# Patient Record
Sex: Male | Born: 1960 | Race: White | Hispanic: No | Marital: Single | Smoking: Never smoker
Health system: Southern US, Community
[De-identification: ages and names within clinical notes are randomized; demographics above are authoritative.]

## PROBLEM LIST (undated history)

## (undated) DIAGNOSIS — C801 Malignant (primary) neoplasm, unspecified: Secondary | ICD-10-CM

## (undated) DIAGNOSIS — L659 Nonscarring hair loss, unspecified: Secondary | ICD-10-CM

## (undated) DIAGNOSIS — B009 Herpesviral infection, unspecified: Secondary | ICD-10-CM

## (undated) DIAGNOSIS — M1990 Unspecified osteoarthritis, unspecified site: Secondary | ICD-10-CM

## (undated) HISTORY — DX: Malignant (primary) neoplasm, unspecified: C80.1

## (undated) HISTORY — PX: LYMPH NODE BIOPSY: SHX201

## (undated) HISTORY — DX: Herpesviral infection, unspecified: B00.9

---

## 2014-05-23 ENCOUNTER — Emergency Department (HOSPITAL_BASED_OUTPATIENT_CLINIC_OR_DEPARTMENT_OTHER)
Admission: EM | Admit: 2014-05-23 | Discharge: 2014-05-23 | Disposition: A | Discharge status: Home / Self Care | Payer: No Typology Code available for payment source | Attending: Emergency Medicine | Admitting: Emergency Medicine

## 2014-05-23 ENCOUNTER — Encounter (HOSPITAL_BASED_OUTPATIENT_CLINIC_OR_DEPARTMENT_OTHER): Payer: Self-pay | Admitting: *Deleted

## 2014-05-23 ENCOUNTER — Emergency Department (HOSPITAL_BASED_OUTPATIENT_CLINIC_OR_DEPARTMENT_OTHER): Payer: No Typology Code available for payment source

## 2014-05-23 DIAGNOSIS — Z791 Long term (current) use of non-steroidal anti-inflammatories (NSAID): Secondary | ICD-10-CM | POA: Diagnosis not present

## 2014-05-23 DIAGNOSIS — Z79899 Other long term (current) drug therapy: Secondary | ICD-10-CM | POA: Insufficient documentation

## 2014-05-23 DIAGNOSIS — S161XXA Strain of muscle, fascia and tendon at neck level, initial encounter: Secondary | ICD-10-CM | POA: Diagnosis not present

## 2014-05-23 DIAGNOSIS — Y9389 Activity, other specified: Secondary | ICD-10-CM | POA: Diagnosis not present

## 2014-05-23 DIAGNOSIS — Z8739 Personal history of other diseases of the musculoskeletal system and connective tissue: Secondary | ICD-10-CM | POA: Diagnosis not present

## 2014-05-23 DIAGNOSIS — Z872 Personal history of diseases of the skin and subcutaneous tissue: Secondary | ICD-10-CM | POA: Insufficient documentation

## 2014-05-23 DIAGNOSIS — S39012A Strain of muscle, fascia and tendon of lower back, initial encounter: Secondary | ICD-10-CM

## 2014-05-23 DIAGNOSIS — Y998 Other external cause status: Secondary | ICD-10-CM | POA: Insufficient documentation

## 2014-05-23 DIAGNOSIS — S29012A Strain of muscle and tendon of back wall of thorax, initial encounter: Secondary | ICD-10-CM | POA: Insufficient documentation

## 2014-05-23 DIAGNOSIS — S3992XA Unspecified injury of lower back, initial encounter: Secondary | ICD-10-CM | POA: Diagnosis present

## 2014-05-23 DIAGNOSIS — Y92410 Unspecified street and highway as the place of occurrence of the external cause: Secondary | ICD-10-CM | POA: Insufficient documentation

## 2014-05-23 DIAGNOSIS — S29019A Strain of muscle and tendon of unspecified wall of thorax, initial encounter: Secondary | ICD-10-CM

## 2014-05-23 HISTORY — DX: Nonscarring hair loss, unspecified: L65.9

## 2014-05-23 HISTORY — DX: Unspecified osteoarthritis, unspecified site: M19.90

## 2014-05-23 MED ORDER — CYCLOBENZAPRINE HCL 10 MG PO TABS: 10.0000 mg | ORAL_TABLET | Freq: Three times a day (TID) | ORAL | Status: AC | PRN
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
## 2014-05-23 NOTE — Discharge Instructions (Signed)
Ibuprofen 600 mg every 6 hours as needed for pain.  Flexeril as prescribed as needed for pain not relieved with ibuprofen.  Follow-up with your regular doctor if not improving in the next week, and return to the ER if you experience any new and concerning symptoms.   Cervical Sprain A cervical sprain is an injury in the neck in which the strong, fibrous tissues (ligaments) that connect your neck bones stretch or tear. Cervical sprains can range from mild to severe. Severe cervical sprains can cause the neck vertebrae to be unstable. This can lead to damage of the spinal cord and can result in serious nervous system problems. The amount of time it takes for a cervical sprain to get better depends on the cause and extent of the injury. Most cervical sprains heal in 1 to 3 weeks. CAUSES  Severe cervical sprains may be caused by:   Contact sport injuries (such as from football, rugby, wrestling, hockey, auto racing, gymnastics, diving, martial arts, or boxing).   Motor vehicle collisions.   Whiplash injuries. This is an injury from a sudden forward and backward whipping movement of the head and neck.  Falls.  Mild cervical sprains may be caused by:   Being in an awkward position, such as while cradling a telephone between your ear and shoulder.   Sitting in a chair that does not offer proper support.   Working at a poorly Landscape architect station.   Looking up or down for long periods of time.  SYMPTOMS   Pain, soreness, stiffness, or a burning sensation in the front, back, or sides of the neck. This discomfort may develop immediately after the injury or slowly, 24 hours or more after the injury.   Pain or tenderness directly in the middle of the back of the neck.   Shoulder or upper back pain.   Limited ability to move the neck.   Headache.   Dizziness.   Weakness, numbness, or tingling in the hands or arms.   Muscle spasms.   Difficulty swallowing or  chewing.   Tenderness and swelling of the neck.  DIAGNOSIS  Most of the time your health care provider can diagnose a cervical sprain by taking your history and doing a physical exam. Your health care provider will ask about previous neck injuries and any known neck problems, such as arthritis in the neck. X-rays may be taken to find out if there are any other problems, such as with the bones of the neck. Other tests, such as a CT scan or MRI, may also be needed.  TREATMENT  Treatment depends on the severity of the cervical sprain. Mild sprains can be treated with rest, keeping the neck in place (immobilization), and pain medicines. Severe cervical sprains are immediately immobilized. Further treatment is done to help with pain, muscle spasms, and other symptoms and may include:  Medicines, such as pain relievers, numbing medicines, or muscle relaxants.   Physical therapy. This may involve stretching exercises, strengthening exercises, and posture training. Exercises and improved posture can help stabilize the neck, strengthen muscles, and help stop symptoms from returning.  HOME CARE INSTRUCTIONS   Put ice on the injured area.   Put ice in a plastic bag.   Place a towel between your skin and the bag.   Leave the ice on for 15-20 minutes, 3-4 times a day.   If your injury was severe, you may have been given a cervical collar to wear. A cervical collar is a two-piece  collar designed to keep your neck from moving while it heals.  Do not remove the collar unless instructed by your health care provider.  If you have long hair, keep it outside of the collar.  Ask your health care provider before making any adjustments to your collar. Minor adjustments may be required over time to improve comfort and reduce pressure on your chin or on the back of your head.  Ifyou are allowed to remove the collar for cleaning or bathing, follow your health care provider's instructions on how to do so  safely.  Keep your collar clean by wiping it with mild soap and water and drying it completely. If the collar you have been given includes removable pads, remove them every 1-2 days and hand wash them with soap and water. Allow them to air dry. They should be completely dry before you wear them in the collar.  If you are allowed to remove the collar for cleaning and bathing, wash and dry the skin of your neck. Check your skin for irritation or sores. If you see any, tell your health care provider.  Do not drive while wearing the collar.   Only take over-the-counter or prescription medicines for pain, discomfort, or fever as directed by your health care provider.   Keep all follow-up appointments as directed by your health care provider.   Keep all physical therapy appointments as directed by your health care provider.   Make any needed adjustments to your workstation to promote good posture.   Avoid positions and activities that make your symptoms worse.   Warm up and stretch before being active to help prevent problems.  SEEK MEDICAL CARE IF:   Your pain is not controlled with medicine.   You are unable to decrease your pain medicine over time as planned.   Your activity level is not improving as expected.  SEEK IMMEDIATE MEDICAL CARE IF:   You develop any bleeding.  You develop stomach upset.  You have signs of an allergic reaction to your medicine.   Your symptoms get worse.   You develop new, unexplained symptoms.   You have numbness, tingling, weakness, or paralysis in any part of your body.  MAKE SURE YOU:   Understand these instructions.  Will watch your condition.  Will get help right away if you are not doing well or get worse. Document Released: 04/05/2007 Document Revised: 06/13/2013 Document Reviewed: 12/14/2012 [facility] Patient Information 2015 [place], [place]. This information is not intended to replace advice given to you by your health care  provider. Make sure you discuss any questions you have with your health care provider.  Lumbosacral Strain Lumbosacral strain is a strain of any of the parts that make up your lumbosacral vertebrae. Your lumbosacral vertebrae are the bones that make up the lower third of your backbone. Your lumbosacral vertebrae are held together by muscles and tough, fibrous tissue (ligaments).  CAUSES  A sudden blow to your back can cause lumbosacral strain. Also, anything that causes an excessive stretch of the muscles in the low back can cause this strain. This is typically seen when people exert themselves strenuously, fall, lift heavy objects, bend, or crouch repeatedly. RISK FACTORS  Physically demanding work.  Participation in pushing or pulling sports or sports that require a sudden twist of the back (tennis, golf, baseball).  Weight lifting.  Excessive lower back curvature.  Forward-tilted pelvis.  Weak back or abdominal muscles or both.  Tight hamstrings. SIGNS AND SYMPTOMS  Lumbosacral strain  may cause pain in the area of your injury or pain that moves (radiates) down your leg.  DIAGNOSIS Your health care provider can often diagnose lumbosacral strain through a physical exam. In some cases, you may need tests such as X-ray exams.  TREATMENT  Treatment for your lower back injury depends on many factors that your clinician will have to evaluate. However, most treatment will include the use of anti-inflammatory medicines. HOME CARE INSTRUCTIONS   Avoid hard physical activities (tennis, racquetball, waterskiing) if you are not in proper physical condition for it. This may aggravate or create problems.  If you have a back problem, avoid sports requiring sudden body movements. Swimming and walking are generally safer activities.  Maintain good posture.  Maintain a healthy weight.  For acute conditions, you may put ice on the injured area.  Put ice in a plastic bag.  Place a towel  between your skin and the bag.  Leave the ice on for 20 minutes, 2-3 times a day.  When the low back starts healing, stretching and strengthening exercises may be recommended. SEEK MEDICAL CARE IF:  Your back pain is getting worse.  You experience severe back pain not relieved with medicines. SEEK IMMEDIATE MEDICAL CARE IF:   You have numbness, tingling, weakness, or problems with the use of your arms or legs.  There is a change in bowel or bladder control.  You have increasing pain in any area of the body, including your belly (abdomen).  You notice shortness of breath, dizziness, or feel faint.  You feel sick to your stomach (nauseous), are throwing up (vomiting), or become sweaty.  You notice discoloration of your toes or legs, or your feet get very cold. MAKE SURE YOU:   Understand these instructions.  Will watch your condition.  Will get help right away if you are not doing well or get worse. Document Released: 03/18/2005 Document Revised: 06/13/2013 Document Reviewed: 01/25/2013 [facility] Patient Information 2015 [place], [place]. This information is not intended to replace advice given to you by your health care provider. Make sure you discuss any questions you have with your health care provider.

## 2014-12-04 ENCOUNTER — Telehealth: Payer: Self-pay | Admitting: Hematology and Oncology

## 2014-12-04 NOTE — Telephone Encounter (Signed)
new patient appt-s/w patient and gave np appt for 06/27 @ 11:15 w/Dr. Alvy Bimler

## 2014-12-17 ENCOUNTER — Encounter: Payer: Self-pay | Admitting: Hematology and Oncology

## 2014-12-17 ENCOUNTER — Ambulatory Visit: Payer: No Typology Code available for payment source

## 2014-12-17 ENCOUNTER — Ambulatory Visit (HOSPITAL_BASED_OUTPATIENT_CLINIC_OR_DEPARTMENT_OTHER): Payer: No Typology Code available for payment source | Admitting: Hematology and Oncology

## 2014-12-17 VITALS — BP 131/76 | HR 72 | Temp 98.6°F | Resp 18 | Ht 73.0 in | Wt 291.6 lb

## 2014-12-17 DIAGNOSIS — C8588 Other specified types of non-Hodgkin lymphoma, lymph nodes of multiple sites: Secondary | ICD-10-CM | POA: Diagnosis not present

## 2014-12-17 NOTE — Progress Notes (Signed)
I checked in new patient with no issues prior to seeing the dr °

## 2014-12-18 ENCOUNTER — Encounter: Payer: Self-pay | Admitting: Hematology and Oncology

## 2014-12-18 DIAGNOSIS — C8588 Other specified types of non-Hodgkin lymphoma, lymph nodes of multiple sites: Secondary | ICD-10-CM | POA: Insufficient documentation

## 2014-12-18 NOTE — Progress Notes (Signed)
[facility] NOTE  Patient Care Team: Jefm Petty, MD as PCP - General (Family Medicine)  CHIEF COMPLAINTS/PURPOSE OF CONSULTATION:  Second opinion for marginal zone lymphoma  HISTORY OF PRESENTING ILLNESS:  Mitchell Gordon 54 y.o. male is here because of recent diagnosis of lymphoma. Disposition was otherwise healthy. Around March 2016, he self palpated a lump in the right supraclavicular region. His outside records is scant at best. I review all his records including imaging study and collaborated the history with this patient. 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 He has new lymph nodes growing in his right axilla over the last 30 days. He has port placed last week with plan to start chemotherapy with bendamustine and rituximab on 12/18/2014 in another facility. The patient denies abnormal anorexia, weight loss or night sweats. No reported skin itching.  MEDICAL HISTORY:  Past Medical History  Diagnosis Date  . Alopecia   . Arthritis   . Herpes simplex infection   . Cancer     nodal marginal zone lymphoma    SURGICAL HISTORY: Past Surgical History  Procedure Laterality Date  . Lymph node biopsy      SOCIAL HISTORY: He is divorced, with shared custody with his ex-wife. He has a 52 year old daughter. His next  of kin including 1 sister in [place] and another one in [place] History   Social History  . Marital Status: Single    Spouse Name: N/A  . Number of Children: N/A  . Years of Education: N/A   Occupational History  . Not on file.   Social History Main Topics  . Smoking status: Never Smoker   . Smokeless tobacco: Not on file  . Alcohol Use: No  . Drug Use: No  . Sexual Activity: No   Other Topics Concern  . Not on file   Social History Narrative    FAMILY HISTORY: No family history of lymphoma. No family history on file.  ALLERGIES:  has no allergies on file.  MEDICATIONS:  Current Outpatient Prescriptions  Medication Sig Dispense Refill  . finasteride (PROSCAR) 5 MG tablet Take 5 mg by mouth daily.    . valACYclovir (VALTREX) 500 MG tablet Take 500 mg by mouth daily.    Marland Kitchen zolpidem (AMBIEN) 10 MG tablet Take 10 mg by mouth at bedtime as needed for sleep.    Marland Kitchen allopurinol (ZYLOPRIM) 300 MG tablet Take 300 mg by mouth daily.    . cyclobenzaprine (FLEXERIL) 10 MG tablet Take 1 tablet (10 mg total) by mouth 3 (three) times daily as needed for muscle spasms. (Patient not taking: Reported on 12/17/2014) 15 tablet 0  . HYDROcodone-acetaminophen (NORCO) 10-325 MG per tablet Take 1 tablet by mouth every 4 (four) hours as needed.    . meloxicam (MOBIC) 7.5 MG tablet Take 7.5 mg by mouth daily.    . ondansetron (ZOFRAN) 8  MG tablet Take by mouth every 8 (eight) hours as needed for nausea or vomiting.     No current facility-administered medications for this visit.    REVIEW OF SYSTEMS:   Constitutional: Denies fevers, chills or abnormal night sweats Eyes: Denies blurriness of vision, double vision or watery eyes Ears, nose, mouth, throat, and face: Denies mucositis or sore throat Respiratory: Denies cough, dyspnea or wheezes Cardiovascular: Denies palpitation, chest discomfort or lower extremity swelling Gastrointestinal:  Denies nausea, heartburn or change in bowel  habits Skin: Denies abnormal skin rashes Neurological:Denies numbness, tingling or new weaknesses Behavioral/Psych: Mood is stable, no new changes  All other systems were reviewed with the patient and are negative.  PHYSICAL EXAMINATION: ECOG PERFORMANCE STATUS: 0 - Asymptomatic  Filed Vitals:   12/17/14 1136  BP: 131/76  Pulse: 72  Temp: 98.6 F (37 C)  Resp: 18   Filed Weights   12/17/14 1136  Weight: 291 lb 9.6 oz (132.269 kg)    GENERAL:alert, no distress and comfortable. He is morbidly obese SKIN: skin color, texture, turgor are normal, no rashes or significant lesions EYES: normal, conjunctiva are pink and non-injected, sclera clear OROPHARYNX:no exudate, no erythema and lips, buccal mucosa, and tongue normal  NECK: supple, thyroid normal size, non-tender, without nodularity LYMPH:  He has large palpable lymphadenopathy on both sides of his neck and axilla. LUNGS: clear to auscultation and percussion with normal breathing effort HEART: regular rate & rhythm and no murmurs and no lower extremity edema ABDOMEN:abdomen soft, non-tender and normal bowel sounds Musculoskeletal:no cyanosis of digits and no clubbing  PSYCH: alert & oriented x 3 with fluent speech NEURO: no focal motor/sensory deficits. He appears anxious  LABORATORY DATA: I have reviewed his last CBC from outside facility dated 10/23/2014 which was normal. LDH was not elevated at 206  RADIOGRAPHIC STUDIES: I review an outside CD containing PET CT scan and agree with the interpretation of the report I have personally reviewed the radiological images as listed and agreed with the findings in the report.  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 Unfortunately, I do not have his outside slides available for internal review. I review with him the current guidelines. I'm concerned about potential malignant transformation to  large cell lymphoma given the rapid growth of the supraclavicular lymph node since his surgery and new masses appearing over the last 30 days. We discussed the risk and benefit of repeating another lymph node biopsy. My concern is he may cause nonhealing wound and delay of initiation of treatment. We also discussed about the choices of chemotherapy. If malignant transformation of lymphoma is suspected, R-CHOP chemotherapy with be the preferred choice. On the other hand, at present time, we have no proof that he had malignant transformation. His LDH was within normal limits. Certainly marginal zone lymphoma can behave aggressively. Bendamustine and rituximab would not be a bad choice in this situation with better tolerability in the long run. I would recommend repeat PET/CT scan after 2 cycles of chemotherapy to assess response to treatment. If suboptimal response is noted, consideration for repeat biopsy would be indicated in that situation. The patient understood he would need minimum 6 cycles of chemotherapy. After that, that might be role for maintenance rituximab after that. After prolonged discussion, the patient is satisfied with the plan of care and he will proceed with chemotherapy in another facility as scheduled. I have not made return appointment for  the patient to come back.     All questions were answered. The patient knows to call the clinic with any problems, questions or concerns. I spent 55 minutes counseling the patient face to face. The total time spent in the appointment was 60 minutes and more than 50% was on counseling.     [place], [place], MD 12/18/2014 4:00 PM

## 2014-12-18 NOTE — Assessment & Plan Note (Signed)
There has extensive discussion with the patient's. Unfortunately, I do not have his outside slides available for internal review. I review with him the current guidelines. I'm concerned about potential malignant transformation to large cell lymphoma given the rapid growth of the supraclavicular lymph node since his surgery and new masses appearing over the last 30 days. We discussed the risk and benefit of repeating another lymph node biopsy. My concern is he may cause nonhealing wound and delay of initiation of treatment. We also discussed about the choices of chemotherapy. If malignant transformation of lymphoma is suspected, R-CHOP chemotherapy with be the preferred choice. On the other hand, at present time, we have no proof that he had malignant transformation. His LDH was within normal limits. Certainly marginal zone lymphoma can behave aggressively. Bendamustine and rituximab would not be a bad choice in this situation with better tolerability in the long run. I would recommend repeat PET/CT scan after 2 cycles of chemotherapy to assess response to treatment. If suboptimal response is noted, consideration for repeat biopsy would be indicated in that situation. The patient understood he would need minimum 6 cycles of chemotherapy. After that, that might be role for maintenance rituximab after that. After prolonged discussion, the patient is satisfied with the plan of care and he will proceed with chemotherapy in another facility as scheduled. I have not made return appointment for the patient to come back.

## 2014-12-19 ENCOUNTER — Encounter: Payer: Self-pay | Admitting: *Deleted

## 2014-12-19 NOTE — Progress Notes (Signed)
Pathology slides from [facility] delivered to Dr. Calton Dach office.  I took slides to [facility] Pathology dept for second opinion by our Pathologist per Dr. Calton Dach request.

## 2014-12-28 ENCOUNTER — Other Ambulatory Visit: Payer: Self-pay | Admitting: Hematology and Oncology

## 2014-12-28 ENCOUNTER — Telehealth: Payer: Self-pay | Admitting: Hematology and Oncology

## 2014-12-28 NOTE — Telephone Encounter (Signed)
I spoke with the patient over the telephone. He is responding well to chemotherapy. He complained of mild nausea. I advised him to take anti-medics. Unfortunately, pathology review is still pending. In any case, since he is better, I told him not to worry.
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
## 2020-12-20 DEATH — deceased
